# Patient Record
Sex: Male | Born: 1990 | Race: White | Hispanic: No | Marital: Single | Smoking: Current every day smoker
Health system: Southern US, Community
[De-identification: ages and names within clinical notes are randomized; demographics above are authoritative.]

## PROBLEM LIST (undated history)

## (undated) HISTORY — PX: TONSILLECTOMY: SUR1361

## (undated) HISTORY — PX: KNEE SURGERY: SHX244

## (undated) HISTORY — PX: WISDOM TOOTH EXTRACTION: SHX21

---

## 2016-01-09 ENCOUNTER — Emergency Department (HOSPITAL_BASED_OUTPATIENT_CLINIC_OR_DEPARTMENT_OTHER): Payer: BLUE CROSS/BLUE SHIELD

## 2016-01-09 ENCOUNTER — Emergency Department (HOSPITAL_BASED_OUTPATIENT_CLINIC_OR_DEPARTMENT_OTHER)
Admission: EM | Admit: 2016-01-09 | Discharge: 2016-01-09 | Disposition: A | Discharge status: Home / Self Care | Payer: BLUE CROSS/BLUE SHIELD | Attending: Emergency Medicine | Admitting: Emergency Medicine

## 2016-01-09 ENCOUNTER — Encounter (HOSPITAL_BASED_OUTPATIENT_CLINIC_OR_DEPARTMENT_OTHER): Payer: Self-pay | Admitting: *Deleted

## 2016-01-09 DIAGNOSIS — Y929 Unspecified place or not applicable: Secondary | ICD-10-CM | POA: Diagnosis not present

## 2016-01-09 DIAGNOSIS — M25511 Pain in right shoulder: Secondary | ICD-10-CM | POA: Insufficient documentation

## 2016-01-09 DIAGNOSIS — Y999 Unspecified external cause status: Secondary | ICD-10-CM | POA: Insufficient documentation

## 2016-01-09 DIAGNOSIS — W1839XA Other fall on same level, initial encounter: Secondary | ICD-10-CM | POA: Diagnosis not present

## 2016-01-09 DIAGNOSIS — Y939 Activity, unspecified: Secondary | ICD-10-CM | POA: Diagnosis not present

## 2016-01-09 DIAGNOSIS — F17200 Nicotine dependence, unspecified, uncomplicated: Secondary | ICD-10-CM | POA: Insufficient documentation

## 2016-01-09 MED ORDER — ONDANSETRON 4 MG PO TBDP
4.0000 mg | ORAL_TABLET | Freq: Once | ORAL | Status: AC
  Administered 2016-01-09: 4 mg via ORAL

## 2016-01-09 MED ORDER — TRAMADOL HCL 50 MG PO TABS: 50.0000 mg | ORAL_TABLET | Freq: Four times a day (QID) | ORAL | Status: DC | PRN

## 2016-01-09 MED ORDER — OXYCODONE-ACETAMINOPHEN 5-325 MG PO TABS
1.0000 | ORAL_TABLET | Freq: Once | ORAL | Status: AC
  Administered 2016-01-09: 1 via ORAL
  Filled 2016-01-09: qty 1

## 2016-01-09 MED ORDER — ONDANSETRON 4 MG PO TBDP
ORAL_TABLET | ORAL | Status: AC
  Filled 2016-01-09: qty 1

## 2016-01-09 NOTE — ED Provider Notes (Signed)
CSN: [id number]     Arrival date & time 01/09/16  0841 History   First MD Initiated Contact with Patient 01/09/16 [phone]     Chief Complaint  Patient presents with  . Shoulder Injury   HPI  [name] is a 25 year old male presenting with a shoulder injury. Affected extremity is the right upper shoulder. Patient states he was drinking heavily last evening when he fell onto his right shoulder. He has had persistent shoulder pain since the incident. Patient states he has broken his collarbone previously and this pain feels similar. The pain is located anteriorly and over the distal collarbone. Pain is exacerbated with movement at the shoulder and elbow. Patient reports restricted range of motion at the shoulder secondary to severe pain. He reports retained range of motion at the elbow, wrist and digits. Denies numbness or weakness of the distal right upper extremity. Denies other injuries sustained in the fall last evening. Denies head injury or LOC. He has not taken any pain medications prior to arrival. No other complaints today.  History reviewed. No pertinent past medical history. Past Surgical History  Procedure Laterality Date  . Tonsillectomy    . Knee surgery Bilateral   . Wisdom tooth extraction     No family history on file. Social History  Substance Use Topics  . Smoking status: Current Every Day Smoker  . Smokeless tobacco: None  . Alcohol Use: Yes    Review of Systems  All other systems reviewed and are negative.     Allergies  Penicillins  Home Medications   Prior to Admission medications   Medication Sig Start Date End Date Taking? Authorizing Provider  traMADol (ULTRAM) 50 MG tablet Take 1 tablet (50 mg total) by mouth every 6 (six) hours as needed. 01/09/16   Teka Chanda, PA-C   BP 132/87 mmHg  Pulse 81  Temp(Src) 98.3 F (36.8 C) (Oral)  Resp 20  Ht 6\' 6"  (1.981 m)  Wt 158.759 kg  BMI 40.45 kg/m2  SpO2 98% Physical Exam  Constitutional: He appears  well-developed and well-nourished. No distress.  Appears to be in moderate amount of pain  HENT:  Head: Normocephalic and atraumatic.  Right Ear: External ear normal.  Left Ear: External ear normal.  Eyes: Conjunctivae are normal. Right eye exhibits no discharge. Left eye exhibits no discharge. No scleral icterus.  Neck: Normal range of motion.  Cardiovascular: Normal rate and intact distal pulses.   Radial pulse palpable  Pulmonary/Chest: Effort normal.  Musculoskeletal:       Right shoulder: He exhibits decreased range of motion, tenderness and bony tenderness. He exhibits no crepitus, no deformity, normal pulse and normal strength.       Arms: Tenderness to palpation over the distal clavicle and anterior humeral head. No obvious deformity or swelling. No crepitus. No range of motion at the shoulder secondary to pain. Maintains range of motion at the elbow, wrist and digits.  Neurological: He is alert. Coordination normal.  5/5 grip strength bilaterally. Deferred strength testing at the elbow and shoulder secondary to pain. Sensation to light touch intact throughout.  Skin: Skin is warm and dry.  No skin tenting or erythema at the shoulder or clavicle  Psychiatric: He has a normal mood and affect. His behavior is normal.  Nursing note and vitals reviewed.   ED Course  Procedures (including critical care time) Labs Review Labs Reviewed - No data to display  Imaging Review Dg Shoulder Right  01/09/2016  CLINICAL DATA:  Injured right shoulder moving furniture boxes last evening. History of prior clavicle fracture. EXAM: RIGHT SHOULDER - 2+ VIEW COMPARISON:  None. FINDINGS: No fracture or dislocation though the humeral head is noted to be mildly inferiorly subluxed in relation to the glenoid. Glenohumeral and acromioclavicular joint spaces are otherwise preserved. No evidence of calcific tendinitis. There is deformity involving the midshaft right bowel wall compatible provided history of  remote clavicular fracture. Regional soft tissues appear normal. No radiopaque foreign body. Limited visualization adjacent thorax is normal. IMPRESSION: 1. No fracture or dislocation. 2. Minimal inferior subluxation of the humeral head in relation to the glenoid, while potentially accentuated due to obliquity, joint laxity and/or a glenohumeral joint effusion could result in a similar appearance. Clinical correlation is advised. 3. Old/healed midshaft right clavicle fracture. Electronically Signed   By: Simonne Come M.D.   On: 01/09/2016 09:26   Ct Shoulder Right Wo Contrast  01/09/2016  CLINICAL DATA:  Right shoulder pain after falling last night. Unable to move the arm. Feeling intense pain and muscle spasms. History of bilateral clavicle fracture. EXAM: CT OF THE RIGHT SHOULDER WITHOUT CONTRAST TECHNIQUE: Multidetector CT imaging was performed according to the standard protocol. Multiplanar CT image reconstructions were also generated. COMPARISON:  Plain films 01/09/2016 FINDINGS: The glenohumeral joint is normally aligned. There is a cortical irregularity along the anterior medial aspect of the humeral head, consistent with a reverse Hill-Sachs deformity. This is best seen on axial image number 42 of series 4. No other fractures are identified. The right lung apex is unremarkable in appearance. No pneumothorax or rib fracture. IMPRESSION: 1. Normal alignment of the glenohumeral joint. 2. 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 Electronically Signed   By: Norva Pavlov M.D.   On: 01/09/2016 10:42   I have personally reviewed and evaluated these images and lab results as part of my medical decision-making.   EKG Interpretation None     9:45 - Xray results reviewed with Dr. Manus Gunning. Will order CT of the shoulder for better evaluation.  MDM   Final diagnoses:  Right shoulder pain   25 year old male presenting with right shoulder pain after fall last evening. Initially hypertensive in  triage, repeat BP before discharge 132/87. Right arm is neurovascularly intact. TTP along the anterior shoulder and distal clavicle. No obvious deformity. No swelling or crepitus. Restricted ROM at shoulder secondary to pain. Given percocet in ED for pain. Initial Xray shows minimal subluxation. CT ordered for better evaluation. CT shoulder shows reversed Hill-Sachs deformity of humeral head with normal alignment at [facility] joint. Pt has sling from previous clavicle fracture. Will keep pt in sling and discharge with short course of pain rx. Pt is to follow up with orthopedist. Return precautions given in discharge paperwork and discussed with pt at bedside. Pt stable for discharge  11:30 - Upon discharge, pt continues to have pain out of proportion to history, exam and imaging findings. Evaluated by Dr. Manus Gunning. Ordered Xray of the humerus. Xray negative. Will discharge pt with ortho follow up as planned.    Alveta Heimlich, PA-C 01/09/16 1107  Correne Lalani, PA-C 01/09/16 1243  Glynn Octave, MD 01/09/16 1610

## 2016-01-09 NOTE — Discharge Instructions (Signed)
Shoulder Pain  The shoulder is the joint that connects your arms to your body. The bones that form the shoulder joint include the upper arm bone (humerus), the shoulder blade (scapula), and the collarbone (clavicle). The top of the humerus is shaped like a ball and fits into a rather flat socket on the scapula (glenoid cavity). A combination of muscles and strong, fibrous tissues that connect muscles to bones (tendons) support your shoulder joint and hold the ball in the socket. Small, fluid-filled sacs (bursae) are located in different areas of the joint. They act as cushions between the bones and the overlying soft tissues and help reduce friction between the gliding tendons and the bone as you move your arm. Your shoulder joint allows a wide range of motion in your arm. This range of motion allows you to do things like scratch your back or throw a ball. However, this range of motion also makes your shoulder more prone to pain from overuse and injury.  Causes of shoulder pain can originate from both injury and overuse and usually can be grouped in the following four categories:  1. Redness, swelling, and pain (inflammation) of the tendon (tendinitis) or the bursae (bursitis).  2. Instability, such as a dislocation of the joint.  3. Inflammation of the joint (arthritis).  4. Broken bone (fracture).  HOME CARE INSTRUCTIONS   1. Apply ice to the sore area.  ¨ Put ice in a plastic bag.  ¨ Place a towel between your skin and the bag.  ¨ Leave the ice on for 15-20 minutes, 3-4 times per day for the first 2 days, or as directed by your health care provider.  2. Stop using cold packs if they do not help with the pain.  3. If you have a shoulder sling or immobilizer, wear it as long as your caregiver instructs. Only remove it to shower or bathe. Move your arm as little as possible, but keep your hand moving to prevent swelling.  4. Squeeze a soft ball or foam pad as much as possible to help prevent swelling.  5. Only take  over-the-counter or prescription medicines for pain, discomfort, or fever as directed by your caregiver.  SEEK MEDICAL CARE IF:   1. Your shoulder pain increases, or new pain develops in your arm, hand, or fingers.  2. Your hand or fingers become cold and numb.  3. Your pain is not relieved with medicines.  SEEK IMMEDIATE MEDICAL CARE IF:   1. Your arm, hand, or fingers are numb or tingling.  2. Your arm, hand, or fingers are significantly swollen or turn white or blue.  MAKE SURE YOU:   1. Understand these instructions.  2. Will watch your condition.  3. Will get help right away if you are not doing well or get worse.     This information is not intended to replace advice given to you by your health care provider. Make sure you discuss any questions you have with your health care provider.     Document Released: 03/22/2005 Document Revised: 07/03/2014 Document Reviewed: 10/05/2014  Elsevier Interactive Patient Education ©2016 [employer]  Shoulder Range of Motion Exercises  Shoulder range of motion (ROM) exercises are designed to keep the shoulder moving freely. They are often recommended for people who have shoulder pain.  MOVEMENT EXERCISE  When you are able, do this exercise 5-6 days per week, or as told by your health care provider. Work toward doing 2 sets of 10 swings.  Pendulum   Exercise  How To Do This Exercise Lying Down  5. Lie face-down on a bed with your abdomen close to the side of the bed.  6. Let your arm hang over the side of the bed.  7. Relax your shoulder, arm, and hand.  8. Slowly and gently swing your arm forward and back. Do not use your neck muscles to swing your arm. They should be relaxed. If you are struggling to swing your arm, have someone gently swing it for you. When you do this exercise for the first time, swing your arm at a 15 degree angle for 15 seconds, or swing your arm 10 times. As pain lessens over time, increase the angle of the swing to 30-45 degrees.  9. Repeat steps 1-4  with the other arm.  How To Do This Exercise While Standing  6. Stand next to a sturdy chair or table and hold on to it with your hand.  ¨ Bend forward at the waist.  ¨ Bend your knees slightly.  ¨ Relax your other arm and let it hang limp.  ¨ Relax the shoulder blade of the arm that is hanging and let it drop.  ¨ While keeping your shoulder relaxed, use body motion to swing your arm in small circles. The first time you do this exercise, swing your arm for about 30 seconds or 10 times. When you do it next time, swing your arm for a little longer.  ¨ Stand up tall and relax.  ¨ Repeat steps 1-7, this time changing the direction of the circles.  7. Repeat steps 1-8 with the other arm.  STRETCHING EXERCISES  Do these exercises 3-4 times per day on 5-6 days per week or as told by your health care provider. Work toward holding the stretch for 20 seconds.  Stretching Exercise 1  4. Lift your arm straight out in front of you.  5. Bend your arm 90 degrees at the elbow (right angle) so your forearm goes across your body and looks like the letter "L."  6. Use your other arm to gently pull the elbow forward and across your body.  7. Repeat steps 1-3 with the other arm.  Stretching Exercise 2  You will need a towel or rope for this exercise.  3. Bend one arm behind your back with the palm facing outward.  4. Hold a towel with your other hand.  5. Reach the arm that holds the towel above your head, and bend that arm at the elbow. Your wrist should be behind your neck.  6. Use your free hand to grab the free end of the towel.  7. With the higher hand, gently pull the towel up behind you.  8. With the lower hand, pull the towel down behind you.  9. Repeat steps 1-6 with the other arm.  STRENGTHENING EXERCISES  Do each of these exercises at four different times of day (sessions) every day or as told by your health care provider. To begin with, repeat each exercise 5 times (repetitions). Work toward doing 3 sets of 12 repetitions or  as told by your health care provider.  Strengthening Exercise 1  You will need a light weight for this activity. As you grow stronger, you may use a heavier weight.  4. Standing with a weight in your hand, lift your arm straight out to the side until it is at the same height as your shoulder.  5. Bend your arm at 90 degrees so that your   fingers are pointing to the ceiling.  6. Slowly raise your hand until your arm is straight up in the air.  7. Repeat steps 1-3 with the other arm.  Strengthening Exercise 2  You will need a light weight for this activity. As you grow stronger, you may use a heavier weight.  1. Standing with a weight in your hand, gradually move your straight arm in an arc, starting at your side, then out in front of you, then straight up over your head.  2. Gradually move your other arm in an arc, starting at your side, then out in front of you, then straight up over your head.  3. Repeat steps 1-2 with the other arm.  Strengthening Exercise 3  You will need an elastic band for this activity. As you grow stronger, gradually increase the size of the bands or increase the number of bands that you use at one time.  1. While standing, hold an elastic band in one hand and raise that arm up in the air.  2. With your other hand, pull down the band until that hand is by your side.  3. Repeat steps 1-2 with the other arm.     This information is not intended to replace advice given to you by your health care provider. Make sure you discuss any questions you have with your health care provider.     Document Released: 03/11/2003 Document Revised: 10/27/2014 Document Reviewed: 06/08/2014  Elsevier Interactive Patient Education ©2016 [employer]

## 2016-01-09 NOTE — ED Notes (Signed)
Patient fell last night around 3 am on right shoulder & states he has broken his collar bone previously bilaterally. Patient states he cannot move arm & is in intense pain.

## 2016-01-10 ENCOUNTER — Ambulatory Visit (INDEPENDENT_AMBULATORY_CARE_PROVIDER_SITE_OTHER): Payer: BLUE CROSS/BLUE SHIELD | Admitting: Family Medicine

## 2016-01-10 ENCOUNTER — Ambulatory Visit: Payer: BLUE CROSS/BLUE SHIELD | Admitting: Family Medicine

## 2016-01-10 VITALS — BP 152/101 | HR 82 | Ht 78.0 in | Wt 345.0 lb

## 2016-01-10 DIAGNOSIS — S4991XA Unspecified injury of right shoulder and upper arm, initial encounter: Secondary | ICD-10-CM | POA: Diagnosis not present

## 2016-01-10 MED ORDER — HYDROCODONE-ACETAMINOPHEN 5-325 MG PO TABS: 1.0000 | ORAL_TABLET | Freq: Four times a day (QID) | ORAL | Status: AC | PRN

## 2016-01-10 MED FILL — HYDROCODON-APAP 5-325: 5-325 | 10 days supply | Qty: 40 | Fill #0

## 2016-01-10 NOTE — Patient Instructions (Addendum)
You suffered a posterior subluxation of your right shoulder. Wear the sling for the next 2 weeks. Icing 15 minutes at a time 3-4 times a day. Ibuprofen 600mg  three times a day OR aleve 2 tabs twice a day with food for pain and inflammation. Take norco as needed for severe pain (no driving on this medicine). Follow up down in [place] in 2 weeks. Generally this is when you would start physical therapy, we would consider musculoskeletal ultrasound or MRI depending on your progress. Consider seeing Dr. Jannifer Rodneyatherine Rainbow (one of my colleagues in [place]).

## 2016-01-12 DIAGNOSIS — S4991XA Unspecified injury of right shoulder and upper arm, initial encounter: Secondary | ICD-10-CM | POA: Insufficient documentation

## 2016-01-12 NOTE — Assessment & Plan Note (Signed)
mechanism along with CT scan consistent with posterior subluxation of right shoulder without dislocation.  No reduction required.  Continue with sling for 2 weeks.  Icing, ibuprofen with norco as needed.  F/u in 2 weeks - is moving to [place] so plans to see a physician down there to hopefully start physical therapy.  Discussed consideration of MSK u/s or MRI (with or without arthrogram) if he's not improving as expected.

## 2016-01-12 NOTE — Progress Notes (Signed)
PCP: No PCP Per Patient  Subjective:   HPI: Patient is a 25 y.o. male here for right shoulder injury.  Patient reports on 7/16 early he tripped over some things on the floor and landed onto both of his elbows. Immediate pain in right shoulder, difficulty moving this. No prior shoulder problems but he has fractured right clavicle in the past (3 years ago). Went to ED - had xrays and CT scan - shown to have a reverse hill sachs lesion. Has been using a sling, icing. Taking tramadol. Pain is 1/10 in the sling, up to 6/10 and sharp with any motion of right shoulder. No skin changes, numbness. Is right handed.  No past medical history on file.  No current outpatient prescriptions on file prior to visit.   No current facility-administered medications on file prior to visit.    Past Surgical History  Procedure Laterality Date  . Tonsillectomy    . Knee surgery Bilateral   . Wisdom tooth extraction      Allergies  Allergen Reactions  . Penicillins     Social History   Social History  . Marital Status: Single    Spouse Name: N/A  . Number of Children: N/A  . Years of Education: N/A   Occupational History  . Not on file.   Social History Main Topics  . Smoking status: Current Every Day Smoker  . Smokeless tobacco: Not on file  . Alcohol Use: Yes  . Drug Use: No  . Sexual Activity: Not on file   Other Topics Concern  . Not on file   Social History Narrative  . No narrative on file    No family history on file.  BP 152/101 mmHg  Pulse 82  Ht 6\' 6"  (1.981 m)  Wt 345 lb (156.491 kg)  BMI 39.88 kg/m2  Review of Systems: See HPI above.    Objective:  Physical Exam:  Gen: NAD, comfortable in exam room  Right shoulder: No swelling, ecchymoses.  No gross deformity. Diffuse tenderness especially posterior shoulder. Full IR and ER though ER painful.  Only able to flex and abduct 30 degrees. Negative Yergasons. Strength 4/5 with ER and IR - both  painful. Negative sulcus. NV intact distally.    Left shoulder: FROM without pain.  Assessment & Plan:  1. Right shoulder injury - mechanism along with CT scan consistent with posterior subluxation of right shoulder without dislocation.  No reduction required.  Continue with sling for 2 weeks.  Icing, ibuprofen with norco as needed.  F/u in 2 weeks - [place] so plans to see a physician down there to hopefully start physical therapy.  Discussed consideration of MSK u/s or MRI (with or without arthrogram) if he's not improving as expected.

## 2018-01-08 IMAGING — CT CT SHOULDER*R* W/O CM
3 of 4 series · 13 of 33 positions shown, 16 images · non-contrast
Comparison: Plain films 01/09/2016

CLINICAL DATA: Right shoulder pain after falling last night. Unable
to move the arm. Feeling intense pain and muscle spasms. History of
bilateral clavicle fracture.

EXAM:
CT OF THE RIGHT SHOULDER WITHOUT CONTRAST
TECHNIQUE: Multidetector CT imaging was performed according to the standard
protocol. Multiplanar CT image reconstructions were also generated.

[Series 4: ax bone · axial · 0.51mm/px · z∈[-170,-21]mm · 7 of 94 slices shown, 9 images]
[im 8/94  soft-tissue]
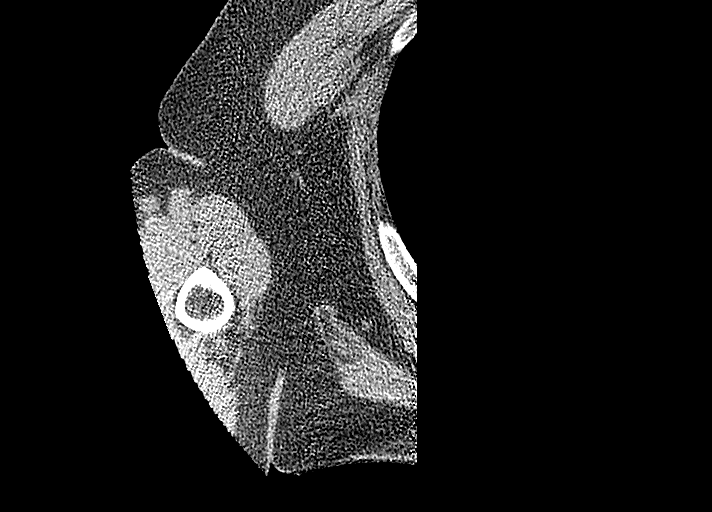
[im 8/94  bone]
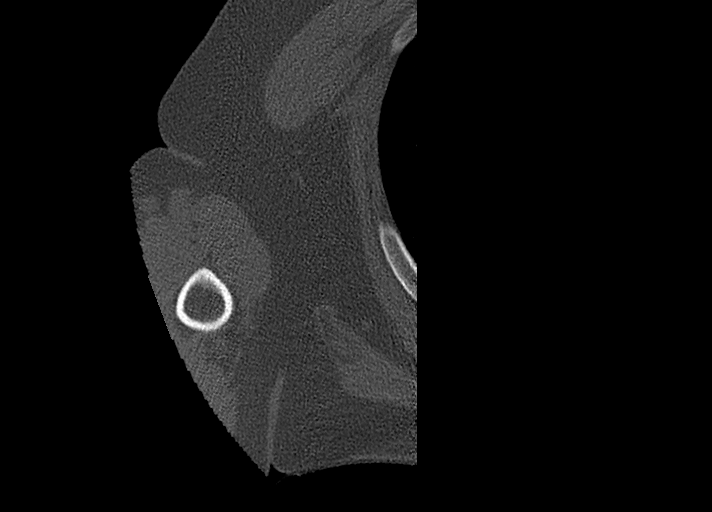
[im 22/94  bone]
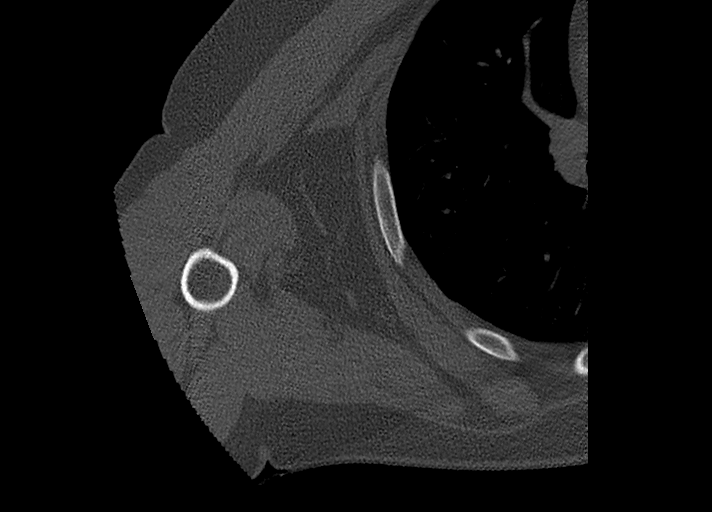
[im 36/94  bone]
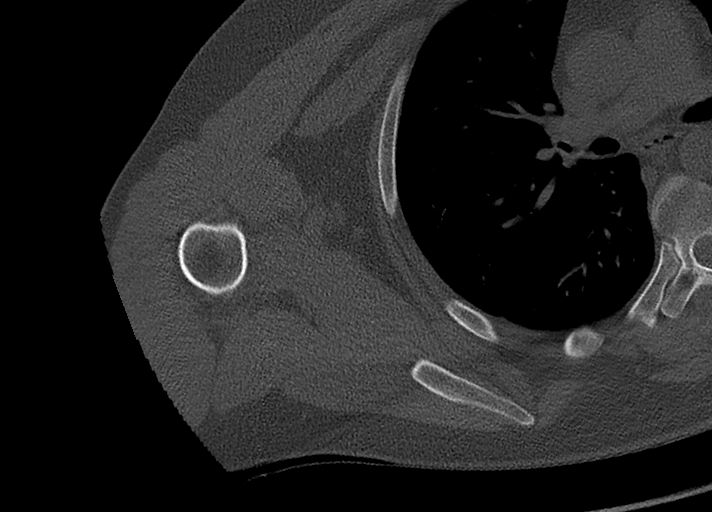
[im 51/94  bone]
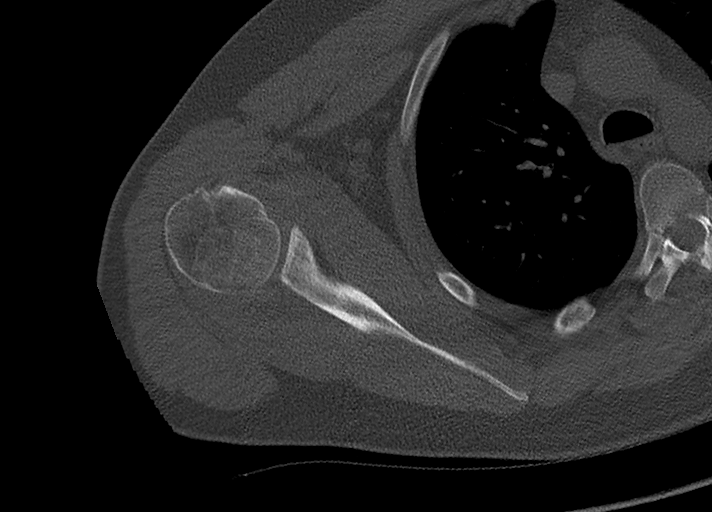
[im 58/94  soft-tissue]
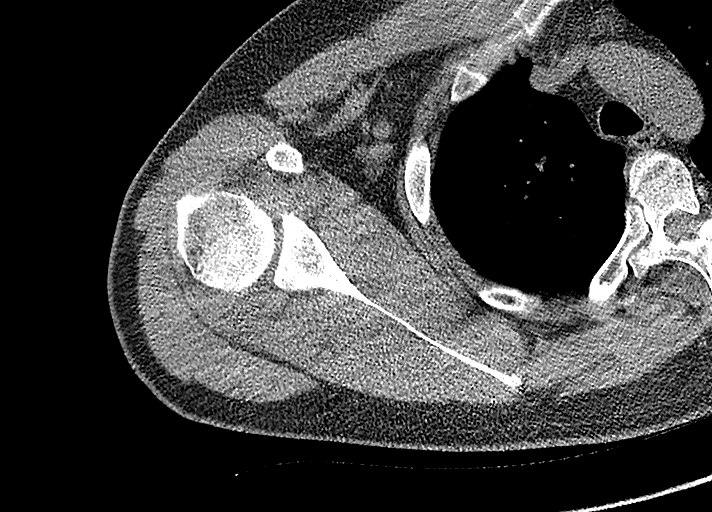
[im 58/94  bone]
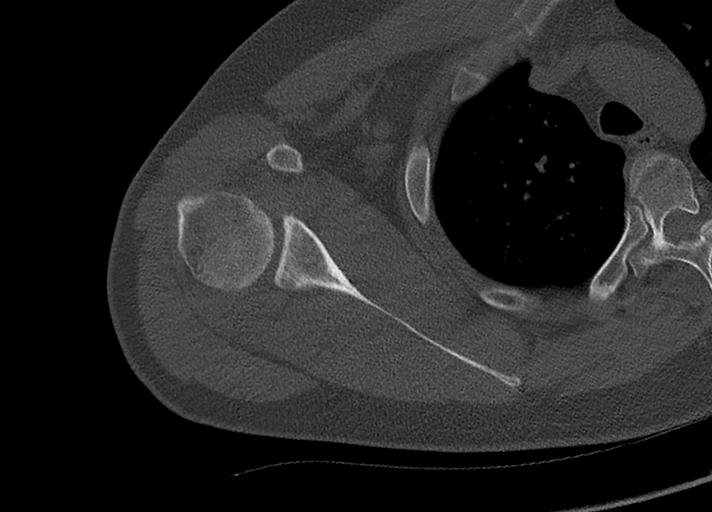
[im 72/94  bone]
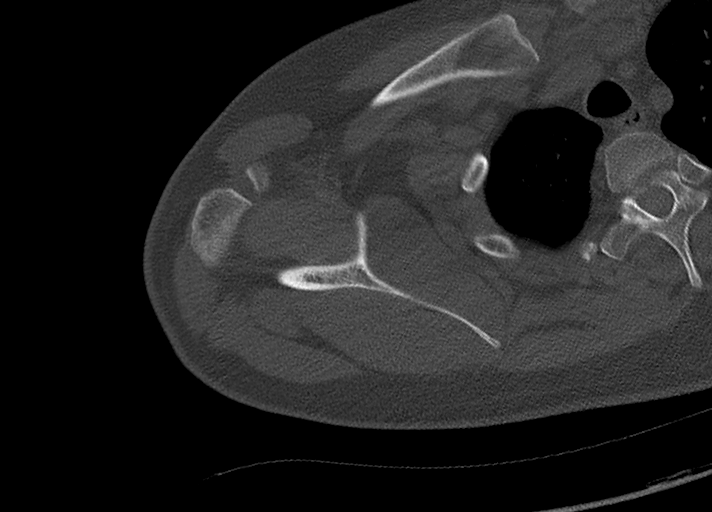
[im 86/94  bone]
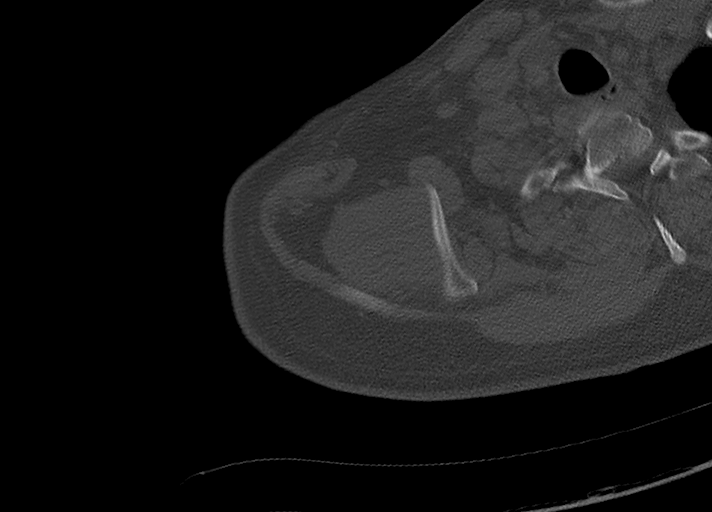

[Series 7: ax st · coronal · 0.54mm/px · 1 of 128 slices shown]
[im 64/128  bone]
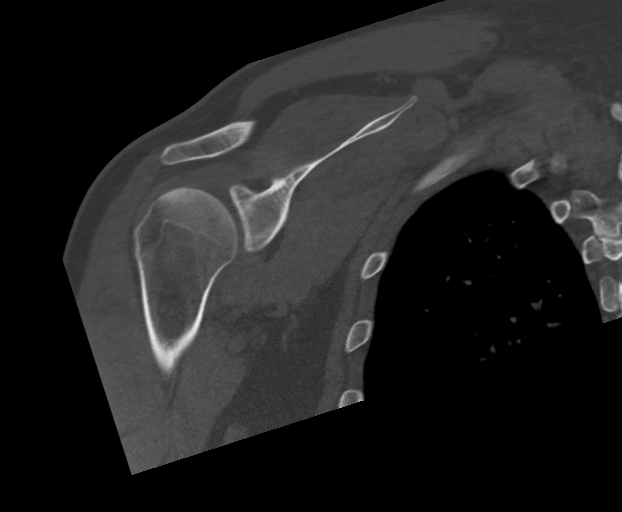

[Series 9: sag st · sagittal · 0.48mm/px · 5 of 139 slices shown, 6 images]
[im 54/139  bone]
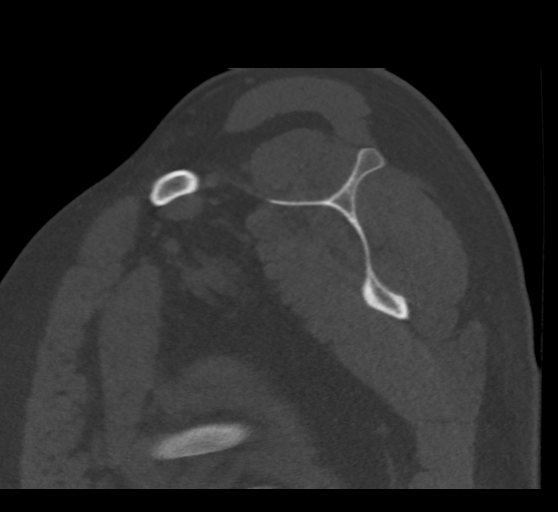
[im 62/139  bone]
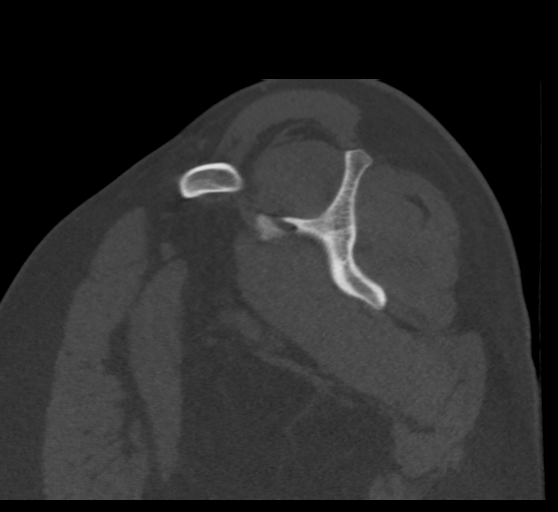
[im 70/139  soft-tissue]
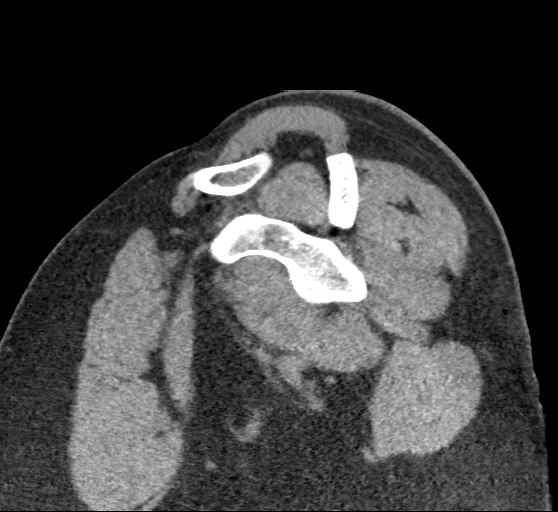
[im 70/139  bone]
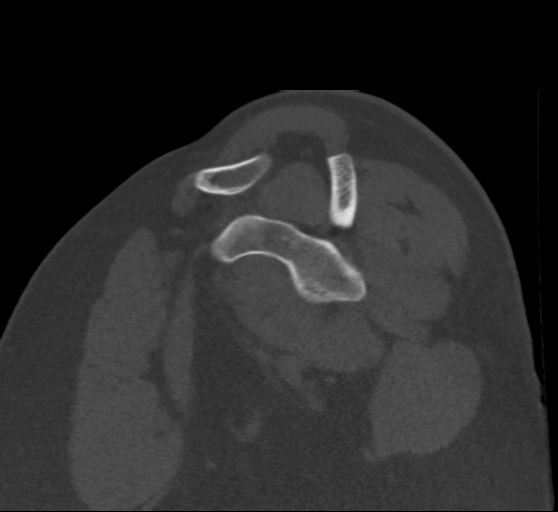
[im 78/139  bone]
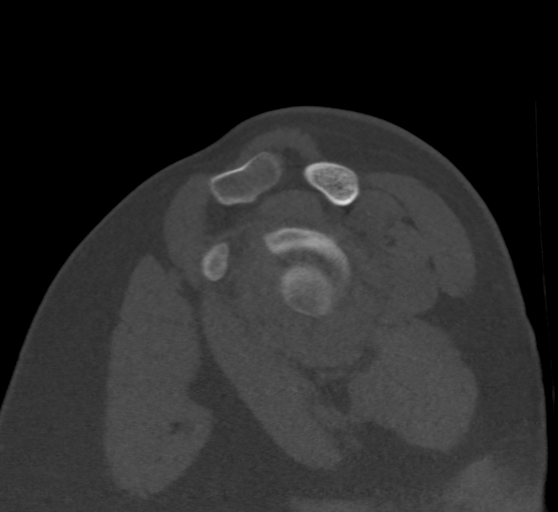
[im 86/139  bone]
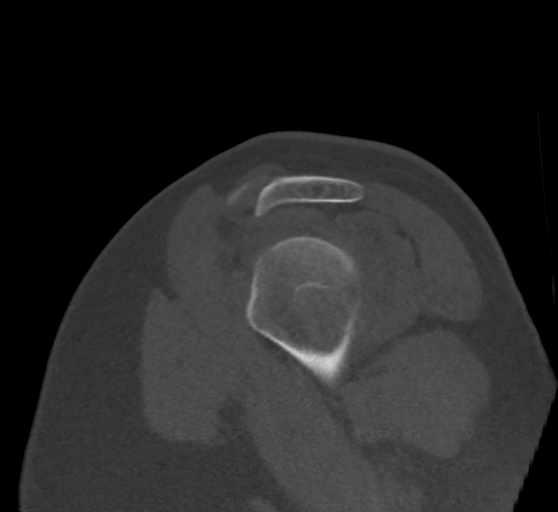

[13 of 33 positions shown; findings below may reference images not displayed]

FINDINGS: The glenohumeral joint is normally aligned. There is a cortical
irregularity along the anterior medial aspect of the humeral head,
consistent with a reverse Hill-Sachs deformity. This is best seen on
axial image number 42 of series 4. No other fractures are
identified. The right lung apex is unremarkable in appearance. No
pneumothorax or rib fracture.
IMPRESSION: 1. Normal alignment of the glenohumeral joint.
2. Reversed Hill-Sachs deformity of the humeral head.
